# Patient Record
Sex: Male | Born: 1965 | Race: White | Hispanic: No | Marital: Single | Smoking: Current every day smoker
Health system: Southern US, Community
[De-identification: ages and names within clinical notes are randomized; demographics above are authoritative.]

## PROBLEM LIST (undated history)

## (undated) DIAGNOSIS — Z87898 Personal history of other specified conditions: Secondary | ICD-10-CM

## (undated) DIAGNOSIS — R0600 Dyspnea, unspecified: Secondary | ICD-10-CM

## (undated) HISTORY — PX: KIDNEY STONE SURGERY: SHX686

## (undated) HISTORY — DX: Dyspnea, unspecified: R06.00

## (undated) HISTORY — DX: Personal history of other specified conditions: Z87.898

---

## 2009-10-07 ENCOUNTER — Ambulatory Visit: Payer: Self-pay | Admitting: Cardiology

## 2009-10-07 ENCOUNTER — Encounter (INDEPENDENT_AMBULATORY_CARE_PROVIDER_SITE_OTHER): Payer: Self-pay | Admitting: *Deleted

## 2009-10-07 ENCOUNTER — Inpatient Hospital Stay (HOSPITAL_COMMUNITY): Admission: EM | Admit: 2009-10-07 | Discharge: 2009-10-09 | Payer: Self-pay | Admitting: Emergency Medicine

## 2009-10-07 LAB — CONVERTED CEMR LAB
Free T4: 0.81 ng/dL
TSH: 6.115 microintl units/mL

## 2009-10-08 ENCOUNTER — Encounter: Payer: Self-pay | Admitting: Cardiology

## 2009-10-08 ENCOUNTER — Encounter (INDEPENDENT_AMBULATORY_CARE_PROVIDER_SITE_OTHER): Payer: Self-pay | Admitting: Internal Medicine

## 2009-10-20 ENCOUNTER — Telehealth (INDEPENDENT_AMBULATORY_CARE_PROVIDER_SITE_OTHER): Payer: Self-pay

## 2009-10-21 ENCOUNTER — Encounter (INDEPENDENT_AMBULATORY_CARE_PROVIDER_SITE_OTHER): Payer: Self-pay

## 2009-11-11 ENCOUNTER — Ambulatory Visit: Payer: Self-pay | Admitting: Cardiology

## 2009-11-11 ENCOUNTER — Encounter (INDEPENDENT_AMBULATORY_CARE_PROVIDER_SITE_OTHER): Payer: Self-pay | Admitting: *Deleted

## 2009-11-20 ENCOUNTER — Ambulatory Visit: Payer: Self-pay | Admitting: Cardiology

## 2009-11-20 DIAGNOSIS — R55 Syncope and collapse: Secondary | ICD-10-CM | POA: Insufficient documentation

## 2009-12-22 ENCOUNTER — Encounter: Payer: Self-pay | Admitting: Cardiology

## 2010-09-25 IMAGING — US US CAROTID DUPLEX BILAT
1 series · 13 of 24 positions shown · non-contrast
Comparison: None

CLINICAL DATA: Syncope, chest pain, left arm pain, weakness

BILATERAL CAROTID DUPLEX ULTRASOUND
TECHNIQUE: Gray scale imaging, color Doppler and duplex ultrasound
was performed of bilateral carotid and vertebral arteries in the
neck.

[Series 1: us carotid duplex bilat · 0.07mm/px · 13 of 64 slices shown]
[im 1/64]
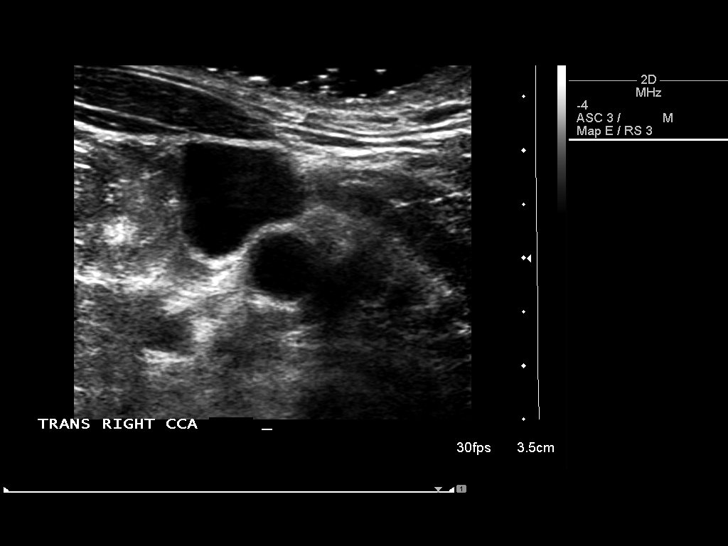
[im 6/64]
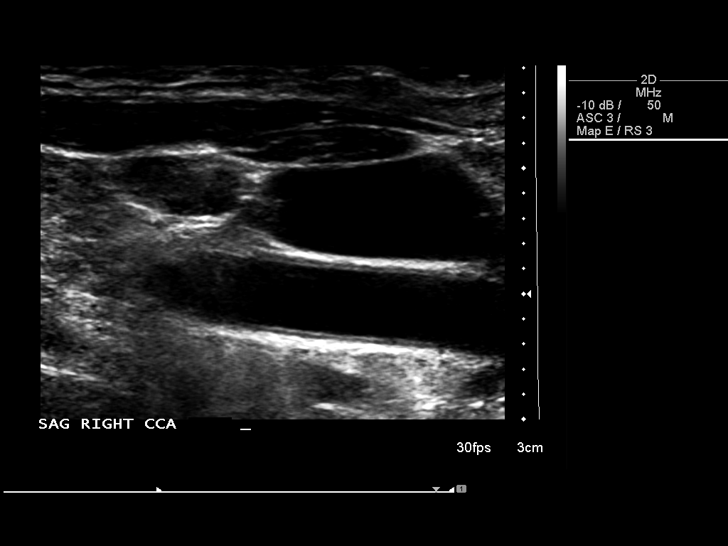
[im 11/64]
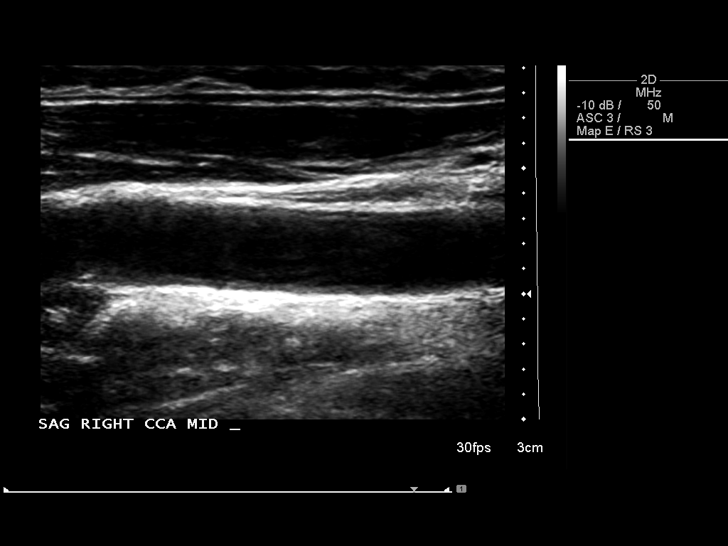
[im 17/64]
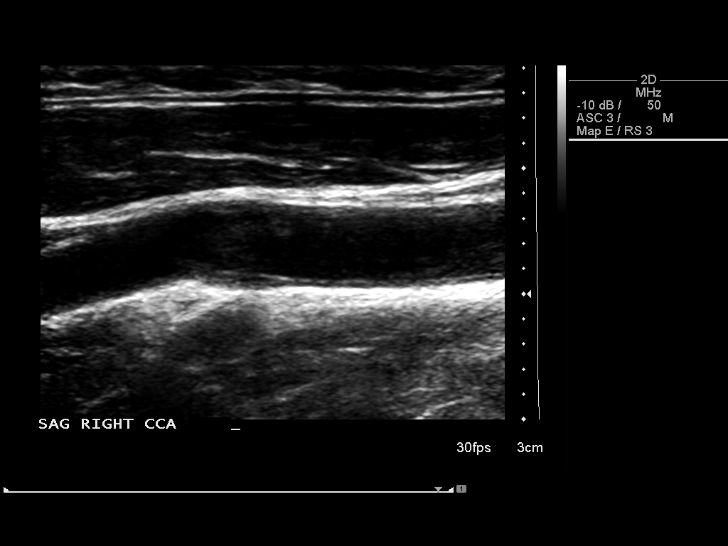
[im 22/64]
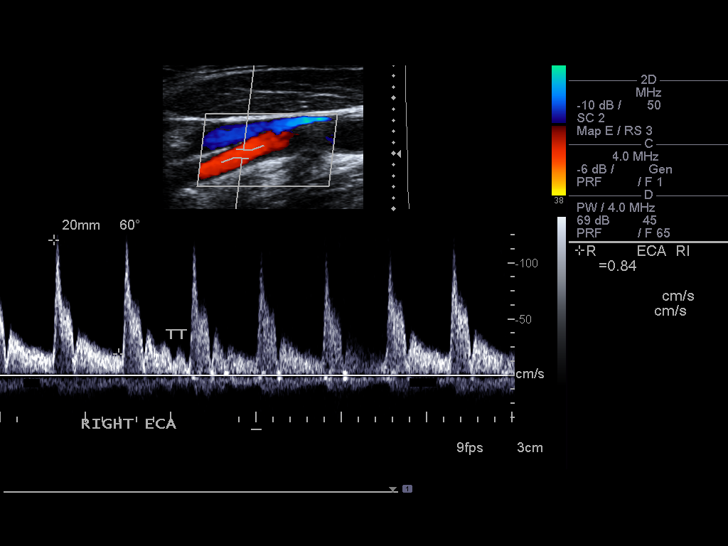
[im 28/64]
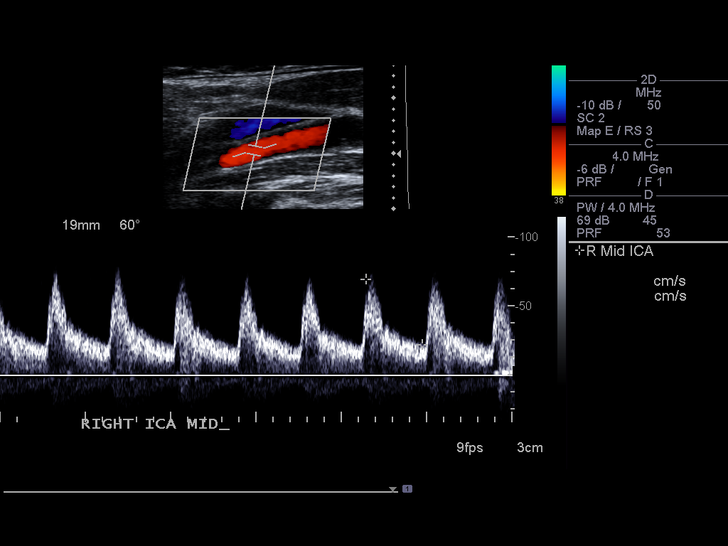
[im 33/64]
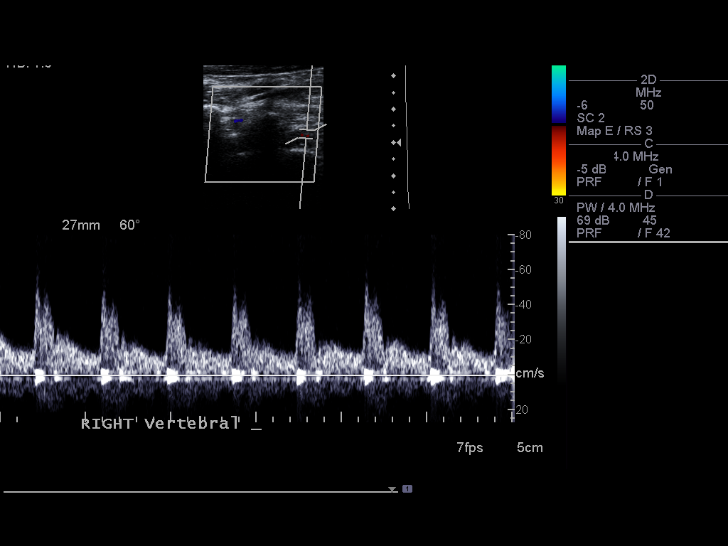
[im 36/64]
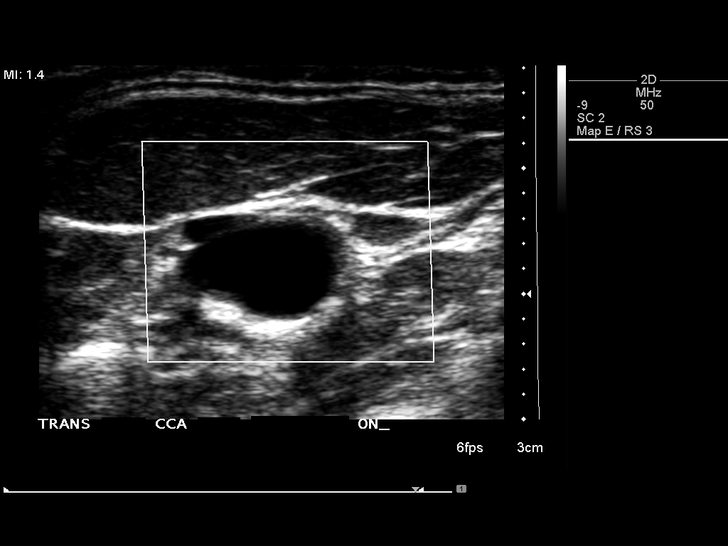
[im 42/64]
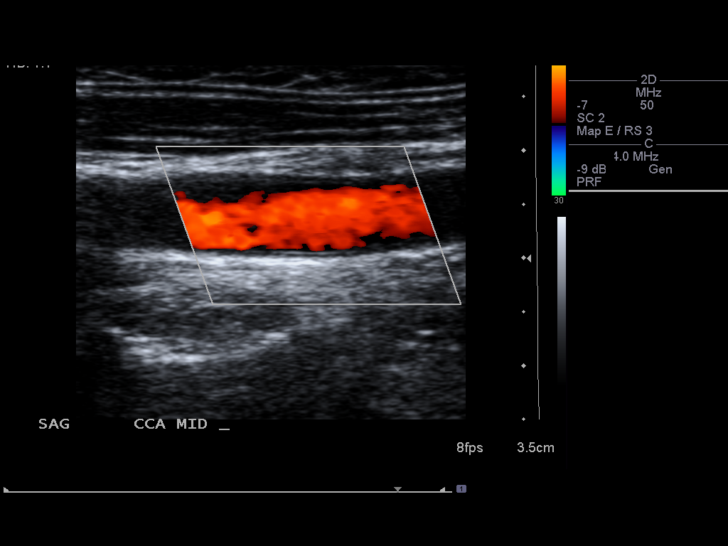
[im 47/64]
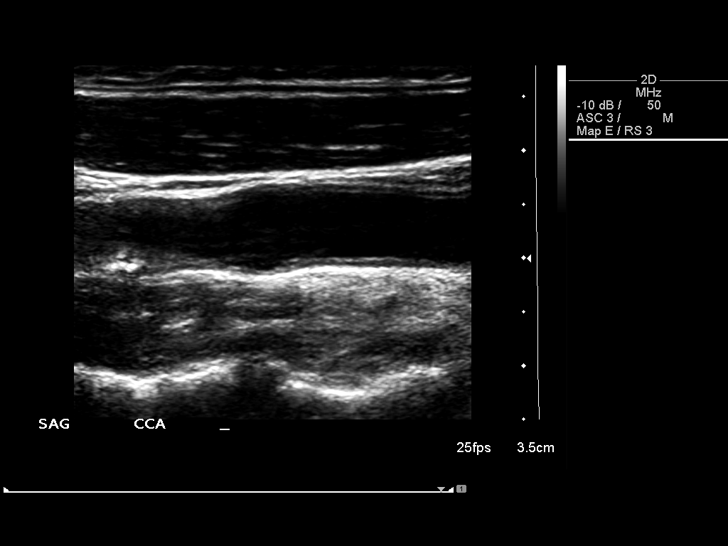
[im 53/64]
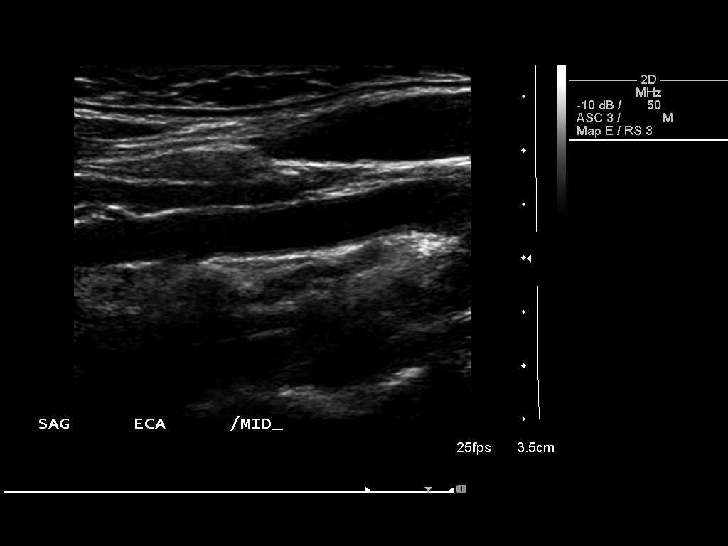
[im 58/64]
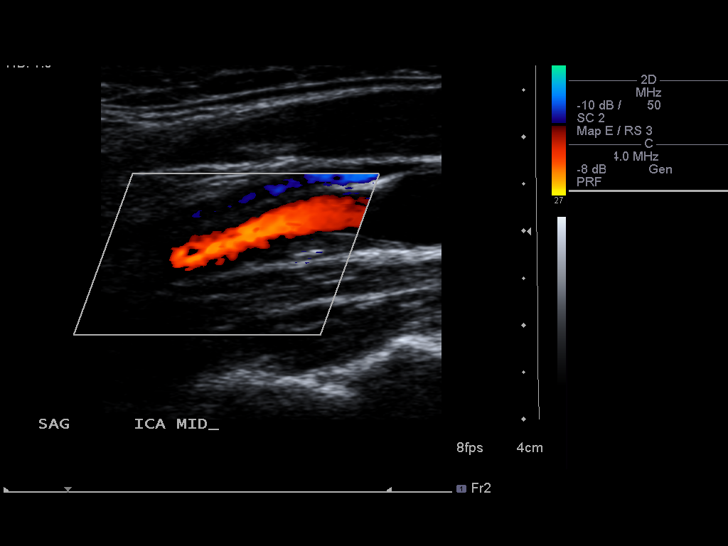
[im 64/64]
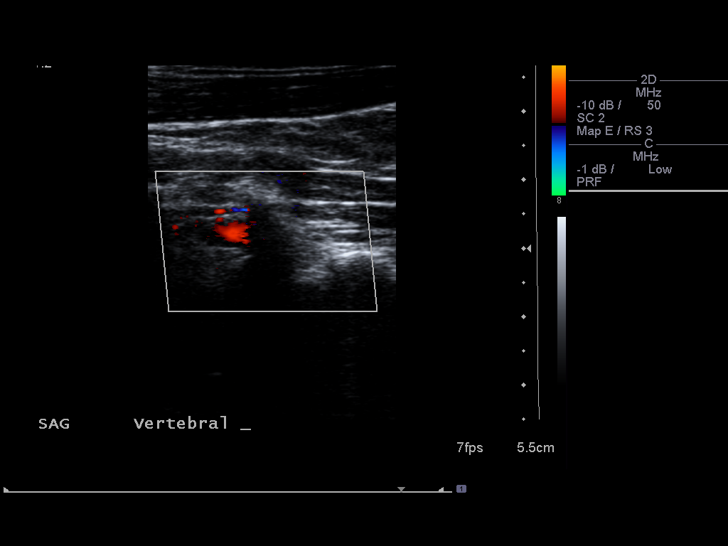

[13 of 24 positions shown; findings below may reference images not displayed]

Criteria:  Quantification of carotid stenosis is based on velocity
parameters that correlate the residual internal carotid diameter
with NASCET-based stenosis levels, using the diameter of the distal
internal carotid lumen as the denominator for stenosis measurement.

The following velocity measurements were obtained:

                 PEAK SYSTOLIC/END DIASTOLIC
RIGHT
ICA:                        89/21cm/sec
CCA:                        78/18cm/sec
SYSTOLIC ICA/CCA RATIO:
DIASTOLIC ICA/CCA RATIO:
ECA:                        120/19cm/sec

LEFT
ICA:                        101/44cm/sec
CCA:                        104/28cm/sec
SYSTOLIC ICA/CCA RATIO:
DIASTOLIC ICA/CCA RATIO:
ECA:                        111/25cm/sec
FINDINGS: RIGHT CAROTID ARTERY: Intimal thickening right CCA.  Minimal plaque
right carotid bulb.  Laminar flow by color Doppler imaging.
Spectral broadening in right ICA on waveform analysis.  No high
velocity jets.

RIGHT VERTEBRAL ARTERY:  Patent, antegrade

LEFT CAROTID ARTERY: Intimal thickening left CCA.  Mild plaque
proximal left ICA.  Turbulent flow left ICA by color Doppler and
waveform analysis.  No high velocity jets.

LEFT VERTEBRAL ARTERY:  Patent, antegrade
IMPRESSION: Minimal plaque formation in the carotid systems bilaterally.
No evidence of hemodynamically significant stenosis.

## 2010-09-26 IMAGING — NM NM MYOCAR MULTI W/SPECT W/WALL MOTION & EF
2 series · 12 of 12 positions shown · non-contrast
Comparison: none

Ordering Physician: Shakir Ronald

Traktor Physician: [REDACTED]al Data: 43-year-old gentleman with chest pain and syncope.
NUCLEAR MEDICINE STRESS MYOVIEW STUDY WITH SPECT AND LEFT
VENTRICULAR EJECTION FRACTION
Radionuclide Data: One-day rest/stress protocol performed with
[DATE] mCi of Ac-YYm Myoview.
Stress Data: Treadmill exercise performed to a workload of 10 mets
and a heart rate of 147, 84% of age predicted maximum.  Exercise
discontinued due to fatigue and mild dyspnea; no chest discomfort
reported.  Blood pressure increased from a resting value of 130/85
to 180/90 during exercise and 190/80 early in recovery, a normal
response.  No arrhythmias noted.
EKG: Normal sinus rhythm with modest sinus arrhythmia; slightly
delayed R-wave progression; otherwise within normal limits.
Stress EKG:  No significant change.
Scintigraphic Data: Acquisition notable for mild diaphragmatic
attenuation.  The left ventricle was mildly dilated.  On
tomographic images reconstructed in standard planes, there was
uniform and normal uptake of tracer in all myocardial segments.
The rest images were unchanged.  The gated reconstruction
demonstrated mild global hypokinesis, most apparent in the septal
and inferoseptal segments.  Overall LV systolic function was mildly
impaired with an estimated ejection fraction of 0.45.  There was
normal systolic accentuation of activity throughout.

[Series 1: cs cardiac tc hi dose · 6.41mm/px · 6 of 512 frames shown]
[frame 43/512]
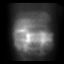
[frame 128/512]
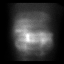
[frame 214/512]
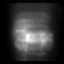
[frame 299/512]
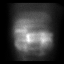
[frame 384/512]
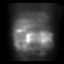
[frame 470/512]
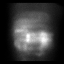

[Series 1: cr cardiac tc low dose · 6.41mm/px · 6 of 64 frames shown]
[frame 6/64]
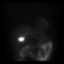
[frame 16/64]
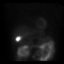
[frame 27/64]
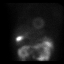
[frame 38/64]
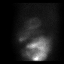
[frame 48/64]
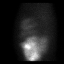
[frame 59/64]
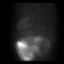

[12 of 12 positions shown; findings below may reference images not displayed]

IMPRESSION: Abnormal stress nuclear myocardial study due to mild left
ventricular dilatation and dysfunction.  Exercise tolerance was
good with neither electrocardiographic nor scintigraphic evidence
for myocardial ischemia or infarction.  Other findings as noted.

## 2010-11-03 NOTE — Miscellaneous (Signed)
Summary: LABS TSH,T4 10/07/2009  Clinical Lists Changes  Observations: Added new observation of TSH: 6.115 microintl units/mL (10/07/2009 10:34) Added new observation of T4, FREE: 0.81 ng/dL (14/78/2956 21:30)

## 2010-11-03 NOTE — Letter (Signed)
Summary: LIFE WATCH  LIFE WATCH   Imported By: Faythe Ghee 12/22/2009 11:52:34  _____________________________________________________________________  External Attachment:    Type:   Image     Comment:   External Document

## 2010-11-03 NOTE — Miscellaneous (Signed)
**Note De-Identified Tiffinie Caillier Obfuscation** Summary: Orders Update: Event monitor  Clinical Lists Changes  Orders: Added new Referral order of Cardionet/Event Monitor (Cardionet/Event) - Signed

## 2010-11-03 NOTE — Assessment & Plan Note (Signed)
Summary: eph   Visit Type:  Initial Consult Primary Provider:  no primary m.d  CC:  fatigued, sob, and chest tightness.  History of Present Illness: Mr. Propes returns today for followup for his syncope.  He was admitted to the hospital first week of January of this year. Please see our consultation, subsequent carotid Dopplers, echocardiogram, and stress nuclear study.  He has not had any recurrent syncope. He does have some presyncope.  He denies any dizziness with standing. His heart rate does increase substantially when he goes from lying to standing however. He drinks plenty of fluids.  He is currently wearing an event recorder but thus far his daily readings have been normal sinus rhythm. He is calm and one event but it appears to be normal.  Current Medications (verified): 1)  Aspir-Low 81 Mg Tbec (Aspirin) .... Take 1 Tab Daily  Allergies (verified): 1)  ! Penicillin 2)  ! * Xray Dye  Past History:  Past Medical History: Last updated: 11/10/2009 Current Problems:  DYSPNEA (ICD-786.05) SYNCOPE, HX OF (ICD-V12.49)  Past Surgical History: Last updated: 11/10/2009 kidney stones  Social History: Last updated: 11/10/2009 Tobacco Use - Yes.  Alcohol Use - no Regular Exercise - no Drug Use - no  Risk Factors: Exercise: no (11/10/2009)  Risk Factors: Smoking Status: current (11/10/2009)  Review of Systems       negative other than history of present illness  Vital Signs:  Patient profile:   45 year old male Height:      69 inches Weight:      193 pounds BMI:     28.60 Pulse rate:   104 / minute BP sitting:   135 / 88  (right arm)  Vitals Entered By: Dreama Saa, CNA (November 20, 2009 3:05 PM)  Physical Exam  General:  Well developed, well nourished, in no acute distress. Head:  normocephalic and atraumatic Eyes:  PERRLA/EOM intact; conjunctiva and lids normal. Neck:  Neck supple, no JVD. No masses, thyromegaly or abnormal cervical nodes. Chest  Jebediah Macrae:  no deformities or breast masses noted Lungs:  Clear bilaterally to auscultation and percussion. Heart:  Non-displaced PMI, chest non-tender; regular rate and rhythm, S1, S2 without murmurs, rubs or gallops. Carotid upstroke normal, no bruit. Normal abdominal aortic size, no bruits. Femorals normal pulses, no bruits. Pedals normal pulses. No edema, no varicosities. Msk:  Back normal, normal gait. Muscle strength and tone normal. Pulses:  pulses normal in all 4 extremities Extremities:  No clubbing or cyanosis. Neurologic:  Alert and oriented x 3. Skin:  Intact without lesions or rashes. Psych:  Normal affect.   Impression & Recommendations:  Problem # 1:  SYNCOPE AND COLLAPSE (ICD-780.2) I have explained the patient today there is no serious calls it we can determine for his syncope. He is not orthostatic in the office today. He seems to stay well hydrated. Though it hasn't vasovagal components, he doesn't have the classic eliciting or provoking stimuli by history. Advised to continue to wear the monitor and call any symptomatic events otherwise, see him back p.r.n. The following medications were removed from the medication list:    Heparin (porcine) in D5w 40-5 Unit/ml-% Soln (Heparin sod (porcine) in d5w) His updated medication list for this problem includes:    Aspir-low 81 Mg Tbec (Aspirin) .Marland Kitchen... Take 1 tab daily

## 2010-11-03 NOTE — Letter (Signed)
Summary: CONSULTATION  CONSULTATION   Imported By: Faythe Ghee 11/20/2009 15:33:33  _____________________________________________________________________  External Attachment:    Type:   Image     Comment:   External Document

## 2010-11-03 NOTE — Progress Notes (Signed)
Summary: CHECKING ON MONITOR  Phone Note Call from Patient Call back at Home Phone [phone]   Caller: PT Reason for Call: Talk to Nurse Summary of Call: PT HAS NOT RECIEVED HOLTER MONITOR YET JUST CALLING TO MAKE SURE IT WAS BEING SENT TO HIM Initial call taken by: Faythe Ghee,  October 20, 2009 12:30 PM  Follow-up for Phone Call        Patient does not have insurance, is in process of filing for disability. Waiting on call from Windell Moulding at the [place] office to see if we have help for indigent patients. Follow-up by: Larita Fife Via LPN,  October 20, 2009 1:01 PM     Appended Document: CHECKING ON MONITOR    Phone Note Outgoing Call   Call placed by: Larita Fife Via LPN,  October 21, 2009 11:47 AM Summary of Call: Chad Browning states he will have [name] call me requarding event monitor for patients with no insurance.  Follow-up for Phone Call        [name] from [facility] called and instructed me on how to enroll indigent patients for event monitor. Will order for Chad Browning ASAP. Follow-up by: Larita Fife Via LPN,  October 21, 2009 12:14 PM

## 2010-12-20 LAB — CARDIAC PANEL(CRET KIN+CKTOT+MB+TROPI)
CK, MB: 0.8 ng/mL (ref 0.3–4.0)
CK, MB: 1 ng/mL (ref 0.3–4.0)
Relative Index: INVALID (ref 0.0–2.5)
Relative Index: INVALID (ref 0.0–2.5)
Total CK: 69 U/L (ref 7–232)

## 2010-12-20 LAB — DIFFERENTIAL
Basophils Absolute: 0 10*3/uL (ref 0.0–0.1)
Eosinophils Absolute: 0.2 10*3/uL (ref 0.0–0.7)
Eosinophils Relative: 2 % (ref 0–5)
Lymphocytes Relative: 24 % (ref 12–46)
Lymphs Abs: 2.1 10*3/uL (ref 0.7–4.0)
Monocytes Absolute: 0.8 10*3/uL (ref 0.1–1.0)
Monocytes Relative: 9 % (ref 3–12)
Neutrophils Relative %: 64 % (ref 43–77)

## 2010-12-20 LAB — URINALYSIS, ROUTINE W REFLEX MICROSCOPIC
Bilirubin Urine: NEGATIVE
Glucose, UA: NEGATIVE mg/dL
Protein, ur: NEGATIVE mg/dL
Specific Gravity, Urine: <1.005 — ABNORMAL LOW (ref 1.005–1.030)
pH: 7 (ref 5.0–8.0)

## 2010-12-20 LAB — CBC
HCT: 43.3 % (ref 39.0–52.0)
MCV: 90.8 fL (ref 78.0–100.0)
WBC: 8.7 10*3/uL (ref 4.0–10.5)

## 2010-12-20 LAB — RAPID URINE DRUG SCREEN, HOSP PERFORMED
Barbiturates: NOT DETECTED
Benzodiazepines: NOT DETECTED
Opiates: NOT DETECTED

## 2010-12-20 LAB — T4, FREE: Free T4: 0.81 ng/dL (ref 0.80–1.80)

## 2010-12-20 LAB — URINE MICROSCOPIC-ADD ON

## 2010-12-20 LAB — TSH: TSH: 6.115 u[IU]/mL — ABNORMAL HIGH (ref 0.350–4.500)

## 2010-12-20 LAB — HEPATIC FUNCTION PANEL
ALT: 16 U/L (ref 0–53)
AST: 20 U/L (ref 0–37)
Alkaline Phosphatase: 63 U/L (ref 39–117)
Total Protein: 6.6 g/dL (ref 6.0–8.3)

## 2010-12-20 LAB — LIPID PANEL
HDL: 34 mg/dL — ABNORMAL LOW (ref >39)
LDL Cholesterol: 149 mg/dL — ABNORMAL HIGH (ref 0–99)
Total CHOL/HDL Ratio: 6.9 RATIO
Triglycerides: 262 mg/dL — ABNORMAL HIGH (ref <150)

## 2010-12-20 LAB — BASIC METABOLIC PANEL
CO2: 29 mEq/L (ref 19–32)
Calcium: 9.1 mg/dL (ref 8.4–10.5)
Creatinine, Ser: 0.97 mg/dL (ref 0.4–1.5)
GFR calc non Af Amer: >60 mL/min (ref >60)
Potassium: 3.7 mEq/L (ref 3.5–5.1)

## 2010-12-20 LAB — POCT CARDIAC MARKERS
CKMB, poc: <1 ng/mL — ABNORMAL LOW (ref 1.0–8.0)
Myoglobin, poc: 57.5 ng/mL (ref 12–200)
Troponin i, poc: <0.05 ng/mL (ref 0.00–0.09)

## 2011-02-16 ENCOUNTER — Emergency Department (HOSPITAL_COMMUNITY)
Admission: EM | Admit: 2011-02-16 | Discharge: 2011-02-16 | Disposition: A | Discharge status: Home / Self Care | Payer: Self-pay | Attending: Emergency Medicine | Admitting: Emergency Medicine

## 2011-02-16 DIAGNOSIS — H5710 Ocular pain, unspecified eye: Secondary | ICD-10-CM | POA: Insufficient documentation

## 2011-02-16 DIAGNOSIS — Z7982 Long term (current) use of aspirin: Secondary | ICD-10-CM | POA: Insufficient documentation

## 2011-09-17 ENCOUNTER — Encounter: Payer: Self-pay | Admitting: Cardiology

## 2013-12-17 ENCOUNTER — Encounter (HOSPITAL_COMMUNITY): Payer: Self-pay | Admitting: Emergency Medicine

## 2013-12-17 ENCOUNTER — Emergency Department (HOSPITAL_COMMUNITY): Payer: Self-pay

## 2013-12-17 ENCOUNTER — Emergency Department (HOSPITAL_COMMUNITY)
Admission: EM | Admit: 2013-12-17 | Discharge: 2013-12-17 | Disposition: A | Discharge status: Home / Self Care | Payer: Self-pay | Attending: Emergency Medicine | Admitting: Emergency Medicine

## 2013-12-17 DIAGNOSIS — Y929 Unspecified place or not applicable: Secondary | ICD-10-CM | POA: Insufficient documentation

## 2013-12-17 DIAGNOSIS — F17200 Nicotine dependence, unspecified, uncomplicated: Secondary | ICD-10-CM | POA: Insufficient documentation

## 2013-12-17 DIAGNOSIS — S52509A Unspecified fracture of the lower end of unspecified radius, initial encounter for closed fracture: Secondary | ICD-10-CM

## 2013-12-17 DIAGNOSIS — S52599A Other fractures of lower end of unspecified radius, initial encounter for closed fracture: Secondary | ICD-10-CM | POA: Insufficient documentation

## 2013-12-17 DIAGNOSIS — Y9389 Activity, other specified: Secondary | ICD-10-CM | POA: Insufficient documentation

## 2013-12-17 DIAGNOSIS — Z880 Allergy status to penicillin: Secondary | ICD-10-CM | POA: Insufficient documentation

## 2013-12-17 DIAGNOSIS — R296 Repeated falls: Secondary | ICD-10-CM | POA: Insufficient documentation

## 2013-12-17 MED ORDER — OXYCODONE-ACETAMINOPHEN 5-325 MG PO TABS
1.0000 | ORAL_TABLET | Freq: Four times a day (QID) | ORAL | Status: AC | PRN
Start: 2013-12-17 — End: ?

## 2013-12-17 NOTE — ED Notes (Signed)
Pt fell on Thursday, Pain , swelling of rt wrist. Good radial pulse. Has been wearing a velcro splint.   Numbness into fingers.

## 2013-12-17 NOTE — Care Management Note (Signed)
ED/CM noted patient did not have health insurance and/or PCP listed in the computer.  Patient was given the [place] resource handout with information on the clinics, food pantries, and the handout for new health insurance sign-up.  Patient expressed appreciation for information received. 

## 2013-12-17 NOTE — ED Notes (Signed)
Pt reports right wrist pain that resulted from a mechanical fall last Thursday.

## 2013-12-17 NOTE — ED Provider Notes (Signed)
CSN: [id number]     Arrival date & time 12/17/13  1131 History   First MD Initiated Contact with Patient 12/17/13 1239     Chief Complaint  Patient presents with  . Wrist Pain     (Consider location/radiation/quality/duration/timing/severity/associated sxs/prior Treatment) HPI Comments: Pt states he sustained a fall March 12. He has had right wrist pain since that time. The pain does not respond well to tylenol.  No pain has become worse and pt can not sleep.  Patient is a 48 y.o. male presenting with wrist pain. The history is provided by the patient.  Wrist Pain This is a new problem. The current episode started in the past 7 days. The problem occurs constantly. The problem has been gradually worsening. Associated symptoms include joint swelling and myalgias. Pertinent negatives include no abdominal pain, arthralgias, chest pain, coughing, neck pain or numbness. Exacerbated by: twisting the right wrist. He has tried acetaminophen for the symptoms. The treatment provided no relief.    Past Medical History  Diagnosis Date  . Dyspnea   . History of syncope    Past Surgical History  Procedure Laterality Date  . Kidney stone surgery     History reviewed. No pertinent family history. History  Substance Use Topics  . Smoking status: Current Every Day Smoker  . Smokeless tobacco: Not on file  . Alcohol Use: No    Review of Systems  Constitutional: Negative for activity change.       All ROS Neg except as noted in HPI  HENT: Negative for nosebleeds.   Eyes: Negative for photophobia and discharge.  Respiratory: Negative for cough, shortness of breath and wheezing.   Cardiovascular: Negative for chest pain and palpitations.  Gastrointestinal: Negative for abdominal pain and blood in stool.  Genitourinary: Negative for dysuria, frequency and hematuria.  Musculoskeletal: Positive for joint swelling and myalgias. Negative for arthralgias, back pain and neck pain.  Skin: Negative.    Neurological: Negative for dizziness, seizures, speech difficulty and numbness.  Psychiatric/Behavioral: Negative for hallucinations and confusion.      Allergies  Iodine and Penicillins  Home Medications   Current Outpatient Rx  Name  Route  Sig  Dispense  Refill  . acetaminophen (TYLENOL) 500 MG tablet   Oral   Take 1,500 mg by mouth every 6 (six) hours as needed for headache.         . ibuprofen (ADVIL,MOTRIN) 200 MG tablet   Oral   Take 600-800 mg by mouth every 6 (six) hours as needed for headache or moderate pain.          BP 133/82  Pulse 105  Temp(Src) 98.4 F (36.9 C) (Oral)  Resp 18  Ht 5' 10.5" (1.791 m)  Wt 185 lb (83.915 kg)  BMI 26.16 kg/m2  SpO2 98% Physical Exam  Nursing note and vitals reviewed. Constitutional: He is oriented to person, place, and time. He appears well-developed and well-nourished.  Non-toxic appearance.  HENT:  Head: Normocephalic.  Right Ear: Tympanic membrane and external ear normal.  Left Ear: Tympanic membrane and external ear normal.  Eyes: EOM and lids are normal. Pupils are equal, round, and reactive to light.  Neck: Normal range of motion. Neck supple. Carotid bruit is not present.  Cardiovascular: Normal rate, regular rhythm, normal heart sounds, intact distal pulses and normal pulses.   Pulmonary/Chest: Breath sounds normal. No respiratory distress.  Abdominal: Soft. Bowel sounds are normal. There is no tenderness. There is no guarding.  Musculoskeletal:  There  is good range of motion of the right shoulder and elbow. There's no deformity of the right forearm. There is deformity of the right wrist with swelling. There is tenderness to palpation. There is tenderness to flexion and extension of the wrist. The radial pulses 2+. The capillary refill is less than 2 seconds.  Lymphadenopathy:       Head (right side): No submandibular adenopathy present.       Head (left side): No submandibular adenopathy present.    He has no  cervical adenopathy.  Neurological: He is alert and oriented to person, place, and time. He has normal strength. No cranial nerve deficit or sensory deficit.  Skin: Skin is warm and dry.  Psychiatric: He has a normal mood and affect. His speech is normal.    ED Course FRACTURE CARE RIGHT WRIST.  The patient sustained a fall from a standing position about 3-4 days ago. He had pain that would not go away, and would not respond to Tylenol.  X-ray revealed an impacted nondisplaced fracture of the right radial metaphysis. The patient was educated on his fracture.  The patient was identified by arm band. Patient was fitted with a sugar tong splint. And sling. After the splint and sling were applied, the patient was evaluated for temperature, color, sensation of the fingers and there were no compromises. The patient tolerated the procedure without problem.   Procedures (including critical care time) Labs Review Labs Reviewed - No data to display Imaging Review Dg Wrist Complete Right  12/17/2013   CLINICAL DATA:  Right wrist pain status post fall  EXAM: RIGHT WRIST - COMPLETE 3+ VIEW  COMPARISON:  None.  FINDINGS: The patient has sustained an acute impacted nondisplaced fracture of the distal right radial metaphysis. The radiocarpal joint appears preserved. The adjacent ulna is intact. The carpal bones and metacarpals appear intact. There is mild soft tissue swelling over the wrist.  IMPRESSION: The patient has sustained an acute impacted nondisplaced fracture of the right radial metaphysis. No significant angulation is demonstrated.   Electronically Signed   By: David  Swazil[place]Jordan   On: 12/17/2013 12:51     EKG Interpretation None      MDM Patient sustained a fall on an outstretched right hand and has been having pain since that time. No other injury reported. X-ray of the right wrist reveals an impacted nondisplaced fracture of the right radial metaphysis.  Patient was fitted with a sugar tong  splint and sling. Patient referred to orthopedics. Prescription for Percocet one every 6 hours given to the patient.    Final diagnoses:  None    *I have reviewed nursing notes, vital signs, and all appropriate lab and imaging results for this patient.    Kathie DikeHobson M Nikodem Leadbetter, PA-C 12/18/13 [phone]

## 2013-12-17 NOTE — Discharge Instructions (Signed)
Wrist Fracture A wrist fracture is a break in one of the bones of the wrist. Your wrist is made up of several small bones at the palm of your hand (carpal bones) and the two bones that make up your forearm (radius and ulna). The bones come together to form multiple large and small joints. The shape and design of these joints allow your wrist to bend and straighten, move side-to-side, and rotate, as in twisting your palm up or down. CAUSES  A fracture may occur in any of the bones in your wrist when enough force is applied to the wrist, such as when falling down onto an outstretched hand. Severe injuries may occur from a more forceful injury. SYMPTOMS Symptoms of wrist fractures include tenderness, bruising, and swelling. Additionally, the wrist may hang in an odd position or may be misshaped. DIAGNOSIS To diagnose a wrist fracture, your caregiver will physically examine your wrist. Your caregiver may also request an X-ray exam of your wrist. TREATMENT Treatment depends on many factors, including the nature and location of the fracture, your age, and your activity level. Treatment for wrist fracture can be nonsurgical or surgical. For nonsurgical treatment, a plaster cast or splint may be applied to your wrist if the bone is in a good position (aligned). The cast will stay on for about 6 weeks. If the alignment of your bone is not good, it may be necessary to realign (reduce) it. After the bone is reduced, a splint usually is placed on your wrist to allow for a small amount of normal swelling. After about 1 week, the splint is removed and a cast is added. The cast is removed 2 or 3 weeks later, after the swelling goes down, causing the cast to loosen. Another cast is applied. This cast is removed after about another 2 or 3 weeks, for a total of 4 to 6 weeks of immobilization. Sometimes the position of the bone is so far out of place that surgery is required to apply a device to hold it together as it  heals. If the bone cannot be reduced without cutting the skin around the bone (closed reduction), a cut (incision) is made to allow direct access to the bone to reduce it (open reduction). Depending on the fracture, there are a number of options for holding the bone in place while it heals, including a cast, metal pins, a plate and screws, and a device called an external fixator. With an external fixator, most of the hardware remains outside of the body. HOME CARE INSTRUCTIONS  To lessen swelling, keep your injured wrist elevated and move your fingers as much as possible.  Apply ice to your wrist for the first 1 to 2 days after you have been treated or as directed by your caregiver. Applying ice helps to reduce inflammation and pain.  Put ice in a plastic bag.  Place a towel between your skin and the bag.  Leave the ice on for 15 to 20 minutes at a time, every 2 hours while you are awake.  Do not put pressure on any part of your cast or splint. It may break.  Use a plastic bag to protect your cast or splint from water while bathing or showering. Do not lower your cast or splint into water.  Only take over-the-counter or prescription medicines for pain as directed by your caregiver. SEEK IMMEDIATE MEDICAL CARE IF:   Your cast or splint gets damaged or breaks.  You have continued severe pain   or more swelling than you did before the cast was put on.  Your skin or fingernails below the injury turn blue or gray or feel cold or numb.  You develop decreased feeling in your fingers. MAKE SURE YOU:  Understand these instructions.  Will watch your condition.  Will get help right away if you are not doing well or get worse. Document Released: 06/30/2005 Document Revised: 12/13/2011 Document Reviewed: 10/08/2011 [employer] Patient Information 2014 [employer].  

## 2013-12-18 NOTE — ED Provider Notes (Signed)
Medical screening examination/treatment/procedure(s) were performed by non-physician practitioner and as supervising physician I was immediately available for consultation/collaboration.   EKG Interpretation None        Joya Gaskinsonald W Aspin Palomarez, MD 12/18/13 [phone]
